# Patient Record
Sex: Female | Born: 1995 | Race: White | Hispanic: No | Marital: Single | Smoking: Never smoker
Health system: Southern US, Community
[De-identification: ages and names within clinical notes are randomized; demographics above are authoritative.]

---

## 2015-02-01 ENCOUNTER — Emergency Department (HOSPITAL_BASED_OUTPATIENT_CLINIC_OR_DEPARTMENT_OTHER)
Admission: EM | Admit: 2015-02-01 | Discharge: 2015-02-01 | Discharge status: Against Medical Advice | Payer: Self-pay | Attending: Emergency Medicine | Admitting: Emergency Medicine

## 2015-02-01 ENCOUNTER — Encounter (HOSPITAL_BASED_OUTPATIENT_CLINIC_OR_DEPARTMENT_OTHER): Payer: Self-pay | Admitting: *Deleted

## 2015-02-01 DIAGNOSIS — R220 Localized swelling, mass and lump, head: Secondary | ICD-10-CM | POA: Insufficient documentation

## 2015-02-01 NOTE — ED Notes (Signed)
Facial swelling. Possible dental abscess. To ED via EMS. Hx of dental abscess about a year ago.

## 2019-10-30 ENCOUNTER — Encounter (HOSPITAL_COMMUNITY): Payer: Self-pay

## 2019-10-30 ENCOUNTER — Emergency Department (HOSPITAL_COMMUNITY)
Admission: EM | Admit: 2019-10-30 | Discharge: 2019-10-30 | Disposition: A | Discharge status: Home / Self Care | Payer: Self-pay | Attending: Emergency Medicine | Admitting: Emergency Medicine

## 2019-10-30 ENCOUNTER — Other Ambulatory Visit: Payer: Self-pay

## 2019-10-30 DIAGNOSIS — R1030 Lower abdominal pain, unspecified: Secondary | ICD-10-CM | POA: Insufficient documentation

## 2019-10-30 DIAGNOSIS — Z5321 Procedure and treatment not carried out due to patient leaving prior to being seen by health care provider: Secondary | ICD-10-CM | POA: Insufficient documentation

## 2019-10-30 NOTE — ED Triage Notes (Signed)
Pt BIB EMS from parking lot. EMS reports pt lethargic and drowsy upon arrival, pt denies taking any drugs (heroin) today. Pt states she has not taken any drugs (heroin) in 5 weeks. Pt c/o lower abdominal pain. Pt states last bowel movement 3 days ago.   114/72 120 HR 16 Resp 97% RA 98.7 CBG 101

## 2019-10-30 NOTE — ED Notes (Signed)
Pt has eloped. Pt did not inform staff she was leaving. Pt was ambulatory, A&Ox4.

## 2019-11-06 ENCOUNTER — Other Ambulatory Visit: Payer: Self-pay

## 2019-11-06 ENCOUNTER — Emergency Department (HOSPITAL_COMMUNITY)
Admission: EM | Admit: 2019-11-06 | Discharge: 2019-11-06 | Discharge status: Against Medical Advice | Payer: Self-pay | Attending: Emergency Medicine | Admitting: Emergency Medicine

## 2019-11-06 DIAGNOSIS — R1013 Epigastric pain: Secondary | ICD-10-CM | POA: Insufficient documentation

## 2019-11-06 DIAGNOSIS — R1031 Right lower quadrant pain: Secondary | ICD-10-CM | POA: Insufficient documentation

## 2019-11-06 DIAGNOSIS — R109 Unspecified abdominal pain: Secondary | ICD-10-CM

## 2019-11-06 DIAGNOSIS — R1011 Right upper quadrant pain: Secondary | ICD-10-CM | POA: Insufficient documentation

## 2019-11-06 LAB — I-STAT BETA HCG BLOOD, ED (MC, WL, AP ONLY): I-stat hCG, quantitative: <5 m[IU]/mL (ref <5)

## 2019-11-06 LAB — CBC WITH DIFFERENTIAL/PLATELET
Abs Immature Granulocytes: 0.05 10*3/uL (ref 0.00–0.07)
Basophils Absolute: 0 10*3/uL (ref 0.0–0.1)
Basophils Relative: 0 %
Eosinophils Absolute: 0.1 10*3/uL (ref 0.0–0.5)
Eosinophils Relative: 1 %
HCT: 38.9 % (ref 36.0–46.0)
Hemoglobin: 12.5 g/dL (ref 12.0–15.0)
Immature Granulocytes: 1 %
Lymphocytes Relative: 14 %
Lymphs Abs: 1.1 10*3/uL (ref 0.7–4.0)
MCH: 28 pg (ref 26.0–34.0)
MCHC: 32.1 g/dL (ref 30.0–36.0)
MCV: 87.2 fL (ref 80.0–100.0)
Monocytes Absolute: 0.7 10*3/uL (ref 0.1–1.0)
Monocytes Relative: 8 %
Neutro Abs: 6 10*3/uL (ref 1.7–7.7)
Neutrophils Relative %: 76 %
Platelets: 359 10*3/uL (ref 150–400)
RBC: 4.46 MIL/uL (ref 3.87–5.11)
RDW: 13.8 % (ref 11.5–15.5)
WBC: 7.9 10*3/uL (ref 4.0–10.5)
nRBC: 0 % (ref 0.0–0.2)

## 2019-11-06 LAB — URINALYSIS, ROUTINE W REFLEX MICROSCOPIC
Bilirubin Urine: NEGATIVE
Glucose, UA: NEGATIVE mg/dL
Ketones, ur: NEGATIVE mg/dL
Leukocytes,Ua: NEGATIVE
Nitrite: NEGATIVE
Protein, ur: NEGATIVE mg/dL
Specific Gravity, Urine: 1.004 — ABNORMAL LOW (ref 1.005–1.030)
pH: 8 (ref 5.0–8.0)

## 2019-11-06 LAB — LIPASE, BLOOD: Lipase: 13 U/L (ref 11–51)

## 2019-11-06 LAB — COMPREHENSIVE METABOLIC PANEL
ALT: 12 U/L (ref 0–44)
AST: 22 U/L (ref 15–41)
Albumin: 2.5 g/dL — ABNORMAL LOW (ref 3.5–5.0)
Alkaline Phosphatase: 57 U/L (ref 38–126)
Anion gap: 15 (ref 5–15)
BUN: <5 mg/dL — ABNORMAL LOW (ref 6–20)
CO2: 21 mmol/L — ABNORMAL LOW (ref 22–32)
Calcium: 8.4 mg/dL — ABNORMAL LOW (ref 8.9–10.3)
Chloride: 99 mmol/L (ref 98–111)
Creatinine, Ser: 0.58 mg/dL (ref 0.44–1.00)
GFR calc Af Amer: >60 mL/min (ref >60)
GFR calc non Af Amer: >60 mL/min (ref >60)
Glucose, Bld: 95 mg/dL (ref 70–99)
Potassium: 4 mmol/L (ref 3.5–5.1)
Sodium: 135 mmol/L (ref 135–145)
Total Bilirubin: 0.6 mg/dL (ref 0.3–1.2)
Total Protein: 7.3 g/dL (ref 6.5–8.1)

## 2019-11-06 MED ORDER — HYDROMORPHONE HCL 1 MG/ML IJ SOLN
1.0000 mg | Freq: Once | INTRAMUSCULAR | Status: AC
  Administered 2019-11-06: 11:00:00 1 mg via INTRAVENOUS
  Filled 2019-11-06: qty 1

## 2019-11-06 MED ORDER — KETOROLAC TROMETHAMINE 15 MG/ML IJ SOLN
15.0000 mg | Freq: Once | INTRAMUSCULAR | Status: AC
  Administered 2019-11-06: 15 mg via INTRAVENOUS
  Filled 2019-11-06: qty 1

## 2019-11-06 MED ORDER — SODIUM CHLORIDE 0.9 % IV BOLUS
500.0000 mL | Freq: Once | INTRAVENOUS | Status: AC
  Administered 2019-11-06: 500 mL via INTRAVENOUS

## 2019-11-06 MED ORDER — HYDROMORPHONE HCL 1 MG/ML IJ SOLN
2.0000 mg | Freq: Once | INTRAMUSCULAR | Status: AC
  Administered 2019-11-06: 2 mg via INTRAVENOUS
  Filled 2019-11-06: qty 2

## 2019-11-06 NOTE — ED Provider Notes (Signed)
[facility] EMERGENCY DEPARTMENT Provider Note  CSN: [id number] Arrival date & time: 11/06/19  0946    History Chief Complaint  Patient presents with  . Abdominal Pain    Nicole Rodgers is a 24 y.o. female who reports no past medical history presented to ED with abdominal pain.  She reports onset was gradual about 3 days ago.  She describing right lower quadrant abdominal pain.  She said the pain has been constant and getting worse for the past 3 days.  She denies any nausea or vomiting.  She says she has not had a bowel movement in many days but that is typical for her.  She says the pain is very sharp pain in the right lower abdomen but also higher up in the right abdomen.  She has never had this kind of pain before.  It is currently 10 out of 10.  She does report dysuria for several days.  Her pain is worse with movement and with breathing.  Nothing makes it better.  She is currently on her menstrual period and does have some intermittent vaginal bleeding.  She denies fevers or chills.  No abdominal surgical hx  Hx of ovarian cyst a few years ago  HPI     No past medical history on file.  There are no problems to display for this patient.   No past surgical history on file.   OB History   No obstetric history on file.     No family history on file.  Social History   Tobacco Use  . Smoking status: Never Smoker  Substance Use Topics  . Alcohol use: No  . Drug use: Yes    Home Medications Prior to Admission medications   Not on File    Allergies    Lac bovis  Review of Systems   Review of Systems  Constitutional: Negative for chills and fever.  Eyes: Negative for photophobia and visual disturbance.  Respiratory: Negative for cough and shortness of breath.   Cardiovascular: Negative for chest pain and palpitations.  Gastrointestinal: Positive for abdominal pain and constipation. Negative for nausea and vomiting.  Genitourinary: Positive  for dysuria and vaginal bleeding.  Musculoskeletal: Negative for arthralgias and back pain.  Skin: Negative for rash and wound.  Neurological: Negative for syncope and light-headedness.  All other systems reviewed and are negative.   Physical Exam Updated Vital Signs BP 101/66   Pulse 94   Temp 98.4 F (36.9 C) (Oral)   Resp 15   Ht 5\' 6"  (1.676 m)   Wt 63.5 kg   LMP 10/30/2019   SpO2 97%   BMI 22.60 kg/m   Physical Exam Vitals and nursing note reviewed.  Constitutional:      General: She is in acute distress.  HENT:     Head: Normocephalic and atraumatic.  Eyes:     Conjunctiva/sclera: Conjunctivae normal.  Cardiovascular:     Rate and Rhythm: Normal rate and regular rhythm.     Heart sounds: No murmur.  Pulmonary:     Effort: Pulmonary effort is normal. No respiratory distress.     Breath sounds: Normal breath sounds.  Abdominal:     Palpations: Abdomen is soft.     Tenderness: There is abdominal tenderness in the right upper quadrant, right lower quadrant and epigastric area. There is guarding. There is no right CVA tenderness, left CVA tenderness or rebound. Positive signs include McBurney's sign.  Musculoskeletal:     Cervical back: Neck  supple.  Skin:    General: Skin is warm and dry.     Comments: Small puncture marks on left forearm  Neurological:     General: No focal deficit present.     Mental Status: She is alert and oriented to person, place, and time.     ED Results / Procedures / Treatments   Labs (all labs ordered are listed, but only abnormal results are displayed) Labs Reviewed  COMPREHENSIVE METABOLIC PANEL - Abnormal; Notable for the following components:      Result Value   CO2 21 (*)    BUN <5 (*)    Calcium 8.4 (*)    Albumin 2.5 (*)    All other components within normal limits  URINALYSIS, ROUTINE W REFLEX MICROSCOPIC - Abnormal; Notable for the following components:   Specific Gravity, Urine 1.004 (*)    Hgb urine dipstick  MODERATE (*)    Bacteria, UA RARE (*)    All other components within normal limits  CBC WITH DIFFERENTIAL/PLATELET  LIPASE, BLOOD  I-STAT BETA HCG BLOOD, ED ([facility], [facility], [facility] ONLY)    EKG None  Radiology No results found.  Procedures Procedures (including critical care time)  Medications Ordered in ED Medications  HYDROmorphone (DILAUDID) injection 1 mg (1 mg Intravenous Given 11/06/19 1031)  ketorolac (TORADOL) 15 MG/ML injection 15 mg (15 mg Intravenous Given 11/06/19 1031)  sodium chloride 0.9 % bolus 500 mL (0 mLs Intravenous Stopped 11/06/19 1337)  HYDROmorphone (DILAUDID) injection 2 mg (2 mg Intravenous Given 11/06/19 1209)    ED Course  I have reviewed the triage vital signs and the nursing notes.  Pertinent labs & imaging results that were available during my care of the patient were reviewed by me and considered in my medical decision making (see chart for details).  24 yo female here with 3 days of worsening abdominal pain She later reports to me that she is a heavy daily opioid user (heroin, IV opioids) and also feels she is withdrawing, but specifies that her abdominal pain is new and idfferent than her typical withdrawal symptoms  This complaint involves an extensive number of treatment options, and is a complaint that carries with it a high risk of complications and morbidity.  The differential diagnosis includes appendicitis vs diveritculitis vs biliary disease vs bowel obstruction vs colitis vs constipation vs cystitis vs pyelonephritis vs kidney stone vs other  No signs or symptoms of sepsis on initial presentation, afebrile Low suspicion for PID, ovarian abscess/torsion given her very sharp and reproducible tenderness on exam including right lower, right middle and right upper abdomen.    Priority is a CT scan to evaluate for perforation vs appendicitis vs intraabdominal emergency.   If her CT imaging is unremarkable, I would proceed with a pelvic exam and consideration for  pelvic ultrasound.  I ordered, reviewed, and interpreted labs, which included BMP, Lipase, CBC, UA.   I ordered medication IV dilaudid and IV toradol for pain control I ordered imaging studies which included CT abdomen and pelvis  No clear evidence of infection on initial labs including UA and CBC.  Lipase wnl.   Clinical Course as of Nov 06 1826  Fri Nov 06, 2019  1123 Patient called me into the room to tell me that she feels like she is going through opioid withdrawal.  She tells me she uses heroin daily and has an extremely high pain tolerance.  She was given a milligram of Dilaudid 45 minutes ago and says it  is a nothing for pain.  I explained that we're not to going to be able to match her outpatient opioid consumption in the ED, but I'm willing to give her another dose of IV medications while waiting on CT scan.   I still think we need a CT scan with her reported symptoms and her tenderness on exam.  We've consulted the IV team as she lost her original IV line.   [MT]  1212 No leukocytosis, no evidence of UTI on UA.  Afebrile with normal vitals.  Pending CT scan   [MT]  1324 Patient is wanting to leave AMA.  She is extremely upset that I am not giving her more IV pain medications, and has told me, " If you're not going to do anything for me I'm leaving."   She has already received 2 dose of IV Dilaudid.  I did explain to her that my priority as an EM doctor is to ensure that she is not having a surgical or life-threatening emergency inside her abdomen or pelvis.  I will do my best to manage her pain, but I have already prescribed hefty doses of our strongest pain medication, and I will not prescribe additional opioids until I have a clearer diagnosis.  She wants to leave.  Instructions were given on her discharge encouraging her to follow up or return if her symptoms worsen.  Discharged AMA   [MT]    Clinical Course User Index [MT] Crystalyn Delia, Kermit Balo, MD    Final Clinical Impression(s) / ED  Diagnoses Final diagnoses:  Abdominal pain, unspecified abdominal location    Rx / DC Orders ED Discharge Orders    None       Renaye Rakers Kermit Balo, MD 11/06/19 [phone]

## 2019-11-06 NOTE — ED Triage Notes (Signed)
Pt arrived via [facility], stating she had abdominal pain x3days. Pt. Stated she started her period 1 week ago however is still bleeding and don't think its coming from her period.  C?O rt. Flank pain. Denies any kidney disease. States her pain 10/10 sharp and constant.  Hx. Of ovarian cyst a few years ago.  EMS VS- 110/62, 103, 18, 96% on RA, 98.4  Pt. Hollering out and crying stating she couldn't move from stretcher to bed.

## 2019-11-06 NOTE — Discharge Instructions (Addendum)
You told us you wanted to leave against medical advice before your workup could be completed.  I had discussed with you my priority to ensure that you do not have an emergency infection or wound inside your abdomen or pelvis.  We did not get a CT scan performed today.  You had asked for pain medication and had been given two doses of very strong medications, an opioid called dilaudid. This is the upper limit of what I felt comfortable prescribing for pain management.  If your pain worsens, you should return to the ER.  Abdominal pain can be caused by many things, some of them non-threatening (like kidney stones, constipation), but some of them potentially life-threatening (like appendicitis, infections, etc).

## 2019-11-06 NOTE — ED Notes (Signed)
Pt. Alert and oriented x4, refusing treatment. Family attempted to talk to pt however she continued to refused.  CT was called and stated she would be 2nd however patient continued to refuse. Patient left AMA. Discharge paperwork signed.

## 2019-12-19 ENCOUNTER — Emergency Department (HOSPITAL_COMMUNITY): Payer: Self-pay

## 2019-12-19 ENCOUNTER — Emergency Department (HOSPITAL_COMMUNITY)
Admission: EM | Admit: 2019-12-19 | Discharge: 2019-12-19 | Payer: Self-pay | Attending: Emergency Medicine | Admitting: Emergency Medicine

## 2019-12-19 DIAGNOSIS — M795 Residual foreign body in soft tissue: Secondary | ICD-10-CM | POA: Insufficient documentation

## 2019-12-19 DIAGNOSIS — R569 Unspecified convulsions: Secondary | ICD-10-CM | POA: Insufficient documentation

## 2019-12-19 LAB — CBC WITH DIFFERENTIAL/PLATELET
Abs Immature Granulocytes: 0.01 10*3/uL (ref 0.00–0.07)
Basophils Absolute: 0 10*3/uL (ref 0.0–0.1)
Basophils Relative: 1 %
Eosinophils Absolute: 0.1 10*3/uL (ref 0.0–0.5)
Eosinophils Relative: 2 %
HCT: 36.8 % (ref 36.0–46.0)
Hemoglobin: 11.7 g/dL — ABNORMAL LOW (ref 12.0–15.0)
Immature Granulocytes: 0 %
Lymphocytes Relative: 25 %
Lymphs Abs: 1.7 10*3/uL (ref 0.7–4.0)
MCH: 27.8 pg (ref 26.0–34.0)
MCHC: 31.8 g/dL (ref 30.0–36.0)
MCV: 87.4 fL (ref 80.0–100.0)
Monocytes Absolute: 0.5 10*3/uL (ref 0.1–1.0)
Monocytes Relative: 7 %
Neutro Abs: 4.2 10*3/uL (ref 1.7–7.7)
Neutrophils Relative %: 65 %
Platelets: 330 10*3/uL (ref 150–400)
RBC: 4.21 MIL/uL (ref 3.87–5.11)
RDW: 15.4 % (ref 11.5–15.5)
WBC: 6.5 10*3/uL (ref 4.0–10.5)
nRBC: 0 % (ref 0.0–0.2)

## 2019-12-19 LAB — RAPID URINE DRUG SCREEN, HOSP PERFORMED
Amphetamines: NOT DETECTED
Barbiturates: NOT DETECTED
Benzodiazepines: POSITIVE — AB
Cocaine: NOT DETECTED
Opiates: NOT DETECTED
Tetrahydrocannabinol: NOT DETECTED

## 2019-12-19 LAB — VALPROIC ACID LEVEL: Valproic Acid Lvl: <10 ug/mL — ABNORMAL LOW (ref 50.0–100.0)

## 2019-12-19 LAB — COMPREHENSIVE METABOLIC PANEL
ALT: 12 U/L (ref 0–44)
AST: 17 U/L (ref 15–41)
Albumin: 3.5 g/dL (ref 3.5–5.0)
Alkaline Phosphatase: 59 U/L (ref 38–126)
Anion gap: 10 (ref 5–15)
BUN: 7 mg/dL (ref 6–20)
CO2: 25 mmol/L (ref 22–32)
Calcium: 9.1 mg/dL (ref 8.9–10.3)
Chloride: 102 mmol/L (ref 98–111)
Creatinine, Ser: 0.7 mg/dL (ref 0.44–1.00)
GFR calc Af Amer: >60 mL/min (ref >60)
GFR calc non Af Amer: >60 mL/min (ref >60)
Glucose, Bld: 93 mg/dL (ref 70–99)
Potassium: 3.9 mmol/L (ref 3.5–5.1)
Sodium: 137 mmol/L (ref 135–145)
Total Bilirubin: 0.6 mg/dL (ref 0.3–1.2)
Total Protein: 7.8 g/dL (ref 6.5–8.1)

## 2019-12-19 LAB — I-STAT BETA HCG BLOOD, ED (MC, WL, AP ONLY): I-stat hCG, quantitative: <5 m[IU]/mL (ref <5)

## 2019-12-19 LAB — MAGNESIUM: Magnesium: 1.9 mg/dL (ref 1.7–2.4)

## 2019-12-19 LAB — CBG MONITORING, ED: Glucose-Capillary: 90 mg/dL (ref 70–99)

## 2019-12-19 LAB — ETHANOL: Alcohol, Ethyl (B): <10 mg/dL (ref <10)

## 2019-12-19 MED ORDER — DIVALPROEX SODIUM 500 MG PO DR TAB: 500.0000 mg | DELAYED_RELEASE_TABLET | Freq: Two times a day (BID) | ORAL | 0 refills | Status: AC

## 2019-12-19 MED ORDER — VALPROATE SODIUM 500 MG/5ML IV SOLN
1200.0000 mg | Freq: Once | INTRAVENOUS | Status: AC
  Administered 2019-12-19: 1200 mg via INTRAVENOUS
  Filled 2019-12-19: qty 12

## 2019-12-19 MED ORDER — LIDOCAINE-EPINEPHRINE-TETRACAINE (LET) TOPICAL GEL
3.0000 mL | Freq: Once | TOPICAL | Status: AC
  Administered 2019-12-19: 3 mL via TOPICAL
  Filled 2019-12-19: qty 3

## 2019-12-19 NOTE — ED Provider Notes (Signed)
[facility] Provider Note   CSN: [id number] Arrival date & time: 12/19/19  1432     History Chief Complaint  Patient presents with  . Seizures    Nicole Rodgers is a 24 y.o. female.  The history is provided by the patient, the EMS personnel and the police. No language interpreter was used.  Seizures  Nicole Rodgers is a 24 y.o. female who presents to the Emergency Department complaining of seizure. Level V caveat due to confusion. The patient presents to the emergency department by EMS and police custody following seizure like activity. She states that she has a history of seizure disorder and has been off of her Depakote for the last three months. She is unsure when her last seizure was. She also has a history of drug abuse and did snort heroin today. She was found by police in a stolen vehicle and when they attempted to stop her if she ran about a quarter to half a mile. When they found her she stated that she was thirsty and then had what they describe as generalized seizure activity that lasted about seven minutes with foaming at the mouth. Following that episode she was groggy but conversant. She was transported to the hospital by EMS. On EMS transport she developed additional generalized tonic clinic activity it was treated with midazolam. She currently complains of feeling fatigued and has a mild headache.    No past medical history on file.  There are no problems to display for this patient.   No past surgical history on file.   OB History   No obstetric history on file.     No family history on file.  Social History   Tobacco Use  . Smoking status: Never Smoker  Substance Use Topics  . Alcohol use: No  . Drug use: Yes    Home Medications Prior to Admission medications   Medication Sig Start Date End Date Taking? Authorizing Provider  divalproex (DEPAKOTE) 500 MG DR tablet Take 1 tablet (500 mg total) by mouth 2 (two) times  daily. 12/19/19   Quintella Reichert, MD    Allergies    Lac bovis  Review of Systems   Review of Systems  Neurological: Positive for seizures.  All other systems reviewed and are negative.   Physical Exam Updated Vital Signs BP 121/82 (BP Location: Right Arm)   Pulse (!) 108   Temp 97.9 F (36.6 C) (Oral)   Resp 18   SpO2 100%   Physical Exam Vitals and nursing note reviewed.  Constitutional:      Appearance: She is well-developed.  HENT:     Head: Normocephalic and atraumatic.     Comments: No tongue injury Cardiovascular:     Rate and Rhythm: Normal rate and regular rhythm.     Heart sounds: No murmur heard.   Pulmonary:     Effort: Pulmonary effort is normal. No respiratory distress.     Breath sounds: Normal breath sounds.  Abdominal:     Palpations: Abdomen is soft.     Tenderness: There is no abdominal tenderness. There is no guarding or rebound.  Musculoskeletal:        General: No tenderness.     Cervical back: Neck supple.  Skin:    General: Skin is warm and dry.  Neurological:     Mental Status: She is alert and oriented to person, place, and time.     Comments: Pupils equal round and reactive. EOM I.  Visual fields are grossly intact. Five out of five strength in all four extremities with sensation to light touch intact in all four extremities  Psychiatric:        Behavior: Behavior normal.     ED Results / Procedures / Treatments   Labs (all labs ordered are listed, but only abnormal results are displayed) Labs Reviewed  RAPID URINE DRUG SCREEN, HOSP PERFORMED - Abnormal; Notable for the following components:      Result Value   Benzodiazepines POSITIVE (*)    All other components within normal limits  CBC WITH DIFFERENTIAL/PLATELET - Abnormal; Notable for the following components:   Hemoglobin 11.7 (*)    All other components within normal limits  VALPROIC ACID LEVEL - Abnormal; Notable for the following components:   Valproic Acid Lvl <10 (*)      All other components within normal limits  COMPREHENSIVE METABOLIC PANEL  ETHANOL  MAGNESIUM  CBC WITH DIFFERENTIAL/PLATELET  CBG MONITORING, ED  I-STAT BETA HCG BLOOD, ED ([facility], [facility], [facility] ONLY)    EKG EKG Interpretation  Date/Time:  Saturday December 19 2019 14:41:34 EDT Ventricular Rate:  117 PR Interval:    QRS Duration: 77 QT Interval:  321 QTC Calculation: 448 R Axis:   63 Text Interpretation: Sinus tachycardia ST elev, probable normal early repol pattern Baseline wander in lead(s) V2 V3 V4 V5 V6 Partial missing lead(s): V2 V3 V4 V5 V6 No old tracing to compare Confirmed by Melene Plan [phone]) on 12/19/2019 2:42:59 PM   Radiology CT Head Wo Contrast  Result Date: 12/19/2019 CLINICAL DATA:  Possible seizure EXAM: CT HEAD WITHOUT CONTRAST TECHNIQUE: Contiguous axial images were obtained from the base of the skull through the vertex without intravenous contrast. COMPARISON:  None. FINDINGS: Brain: No evidence of acute infarction, hemorrhage, hydrocephalus, extra-axial collection or mass lesion/mass effect. Vascular: No hyperdense vessel or unexpected calcification. Skull: Normal. Negative for fracture or focal lesion. Sinuses/Orbits: No acute finding. Other: None. IMPRESSION: No acute intracranial process. Electronically Signed   By: Romona Curls M.D.   On: 12/19/2019 16:54    Procedures .Foreign Body Removal  Date/Time: 12/19/2019 3:36 PM Performed by: Tilden Fossa, MD Authorized by: Tilden Fossa, MD  Body area: skin General location: trunk Location details: abdomen Anesthesia method: topical.  Anesthesia: Local Anesthetic: LET (lido,epi,tetracaine)  Sedation: Patient sedated: no  Patient cooperative: yes Removal mechanism: manual traction. Dressing: antibiotic ointment and dressing applied Tendon involvement: none Depth: subcutaneous Complexity: simple 2 objects recovered. Post-procedure assessment: foreign body removed Patient tolerance: patient tolerated the  procedure well with no immediate complications Comments: Two Taser barbs were removed from the patient. One was located in the left upper thigh. The one in the left upper thigh was easily removed with traction. The Taser Barb in the abdominal wall could not be removed with medial traction and a very small incision with a number 11 blade was made along the Barb tract and the bar was removed with medial traction. The patient was anesthetized with topical LET gel.   (including critical care time)  Medications Ordered in ED Medications  lidocaine-EPINEPHrine-tetracaine (LET) topical gel (3 mLs Topical Given 12/19/19 1539)  valproate (DEPACON) 1,200 mg in dextrose 5 % 50 mL IVPB (0 mg Intravenous Stopped 12/19/19 1939)    ED Course  I have reviewed the triage vital signs and the nursing notes.  Pertinent labs & imaging results that were available during my care of the patient were reviewed by me and considered in my medical decision  making (see chart for details).    MDM Rules/Calculators/A&P                         Patient presents and police custody following possible seizure like activity. On ED arrival she had witnessed pseudo-seizure activity by oncoming physician. Based off of EMS and police report unclear if she had seizure like activity prior to ED arrival. Unable to obtain all records. She is neurologically intact on evaluation. She reports a history of taking Depakote, will restart. She did try to get out of bed and stick a needle in her neck for police to try and leave the department and she was tased during her ED stay. Taser barbs were removed from the patient per separate procedure note. Plan to discharge and police custody with outpatient follow-up and return precautions. Patient is unclear when her last tetanus shot was, declines having her tetanus shot updated at this time.  Final Clinical Impression(s) / ED Diagnoses Final diagnoses:  Seizure-like activity (HCC)  Foreign body (FB)  in soft tissue    Rx / DC Orders ED Discharge Orders         Ordered    divalproex (DEPAKOTE) 500 MG DR tablet  2 times daily     Discontinue  Reprint     12/19/19 1737           Tilden Fossa, MD 12/19/19 2008

## 2019-12-19 NOTE — ED Notes (Signed)
Pt transported to CT ?

## 2019-12-19 NOTE — ED Notes (Signed)
Pt found in hall, holding needle to throat, tased by [employer]. Provider made aware, pt returned to room, tasers removed. Pt denies suicidal ideation at this time.

## 2019-12-19 NOTE — ED Notes (Signed)
Pt found in hall, holding needle to throat, pt tased by [employer]. Pt returned to room, provider made aware, tasers removed by MD. Pt denies suicidal ideation,

## 2019-12-19 NOTE — ED Triage Notes (Signed)
Pt BIB [facility], in police custody. Pt admits to using fentanyl, meth, cocaine, and heroin in the last hour, began seizing. 5 medazolam given PTA. Pt able to communicate with EMS, still convulsing upon arrival. Pt tachycardic in 110'S, VS stable otherwise. NAD noted.

## 2019-12-19 NOTE — ED Provider Notes (Signed)
MSE was initiated and I personally evaluated the patient and placed orders (if any) at  2:42 PM on December 19, 2019.  The patient appears stable so that the remainder of the MSE may be completed by another provider.  24 year old female with a chief complaints of seizure-like activity.  Patient reportedly has a history of seizures.  Started when she was in police custody.  As I looked in the patient's medical record she has had multiple visits for seizure-like activity usually when she is under custody of the police.  While she was having some shaking activity I was able to provide painful stimuli that got her seizure to stop and she turned and looked at me.  When a dressing was being removed while the patient was shaking she stopped and told someone that she need to keep it on.  Per EMS she had had constant contact with them throughout her shaking in route.  Will labs in the ED for short time.  Basic lab work.  CBG.   Melene Plan, DO 12/19/19 1444

## 2021-01-30 DEATH — deceased

## 2021-04-04 IMAGING — CT CT HEAD W/O CM
3 of 4 series · 15 of 47 positions shown, 18 images · non-contrast
Comparison: None.

CLINICAL DATA: Possible seizure

EXAM:
CT HEAD WITHOUT CONTRAST
TECHNIQUE: Contiguous axial images were obtained from the base of the skull
through the vertex without intravenous contrast.

[Series 4: head without · axial · non-contrast · 0.44mm/px · z∈[-81,+44]mm · 9 of 31 slices shown, 12 images]
[im 3/31  brain]
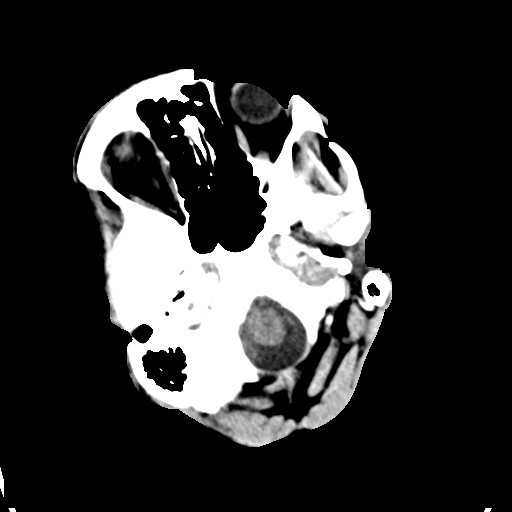
[im 3/31  bone]
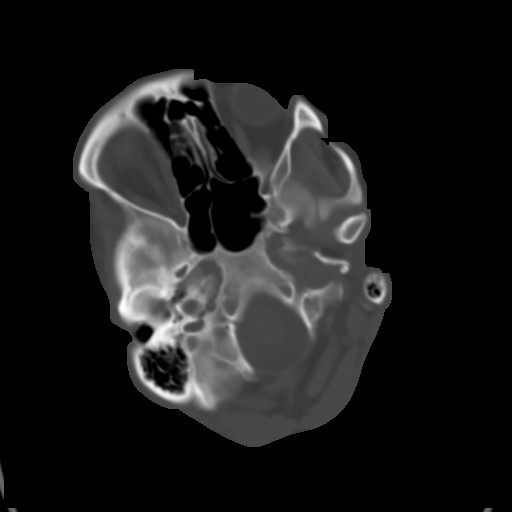
[im 7/31  brain]
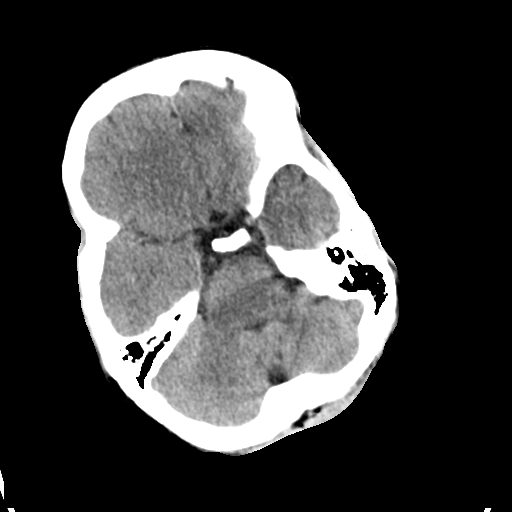
[im 9/31  brain]
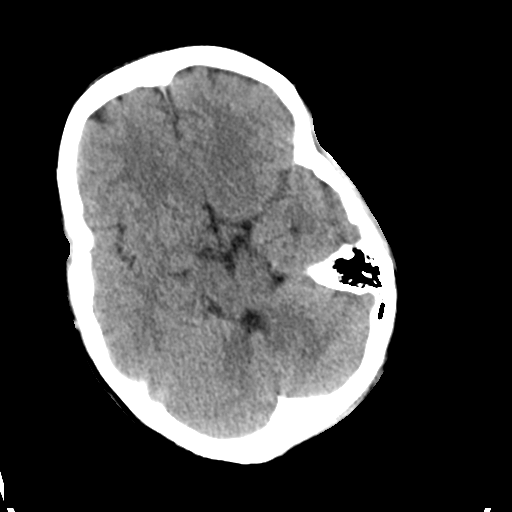
[im 13/31  brain]
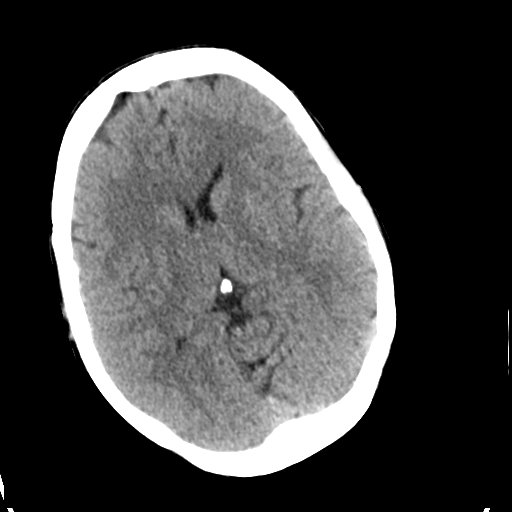
[im 16/31  brain]
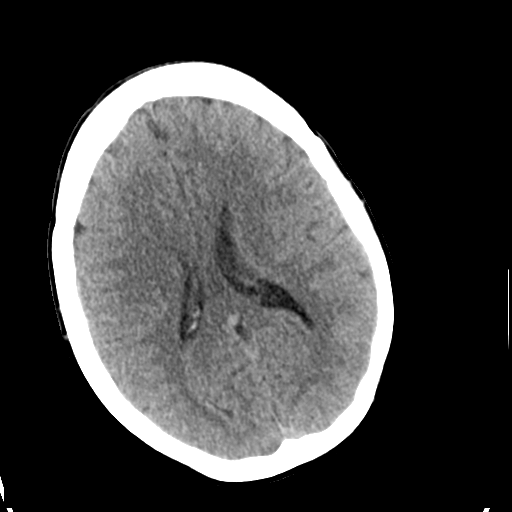
[im 16/31  bone]
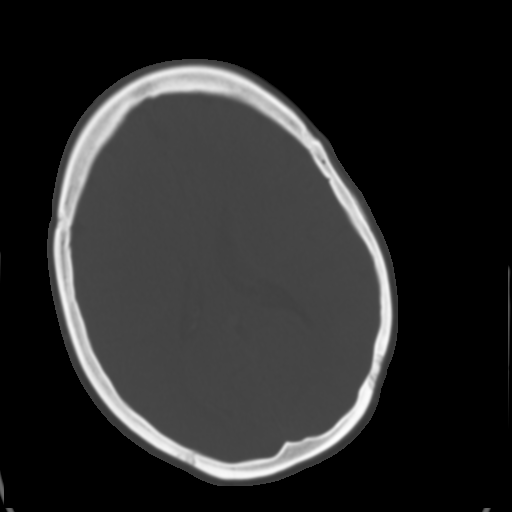
[im 18/31  brain]
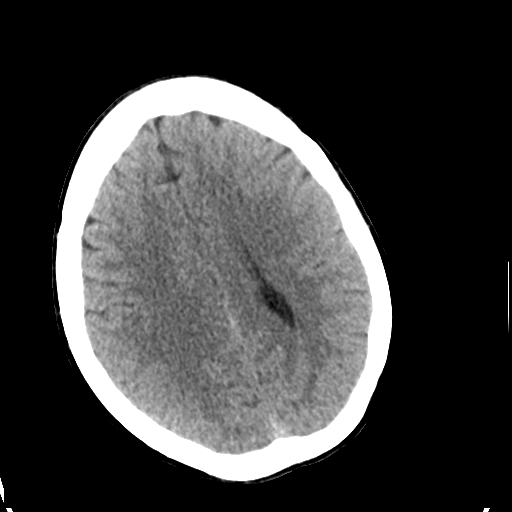
[im 22/31  brain]
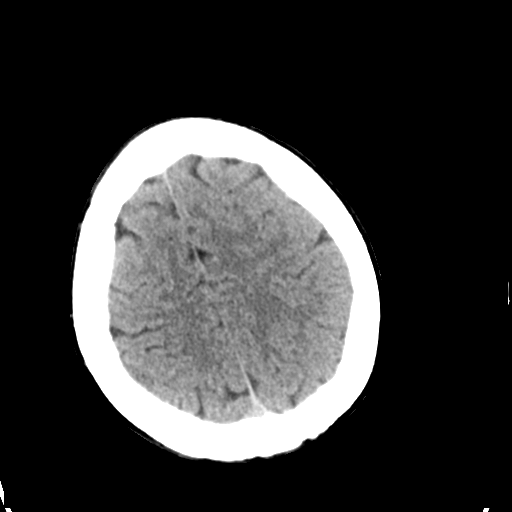
[im 24/31  brain]
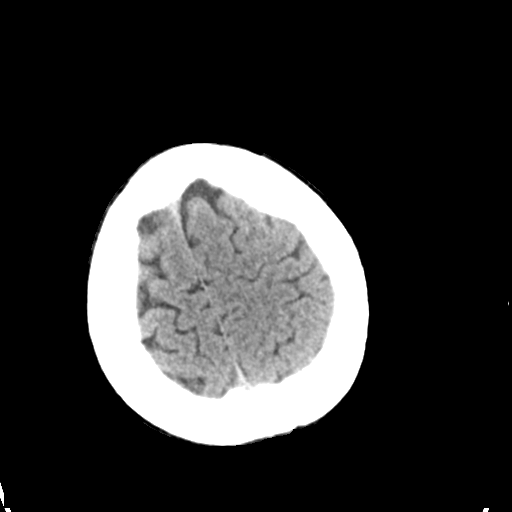
[im 28/31  brain]
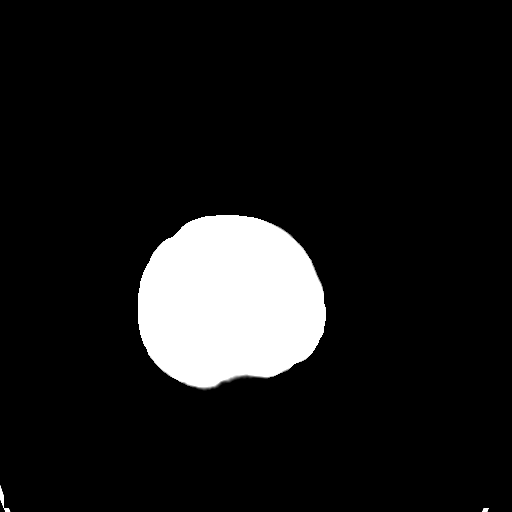
[im 28/31  bone]
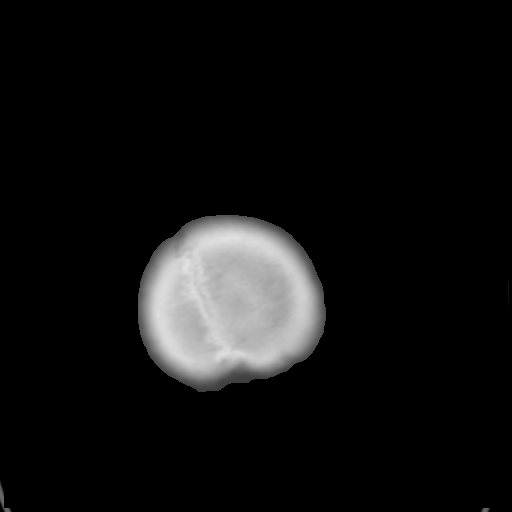

[Series 5: head without cor · coronal · non-contrast · 0.33mm/px · 3 of 66 slices shown]
[im 22/66  brain]
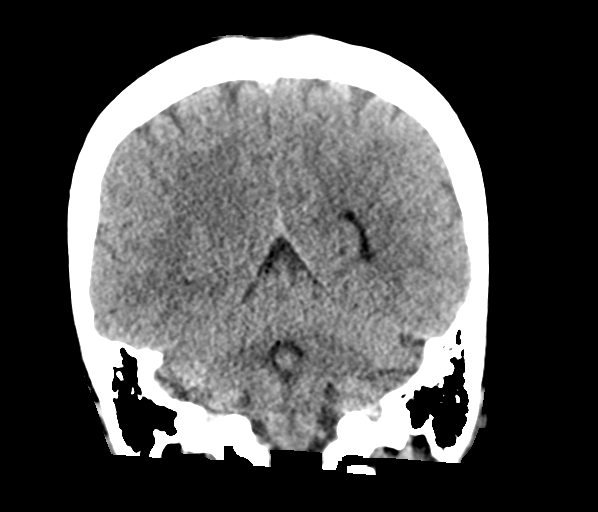
[im 29/66  brain]
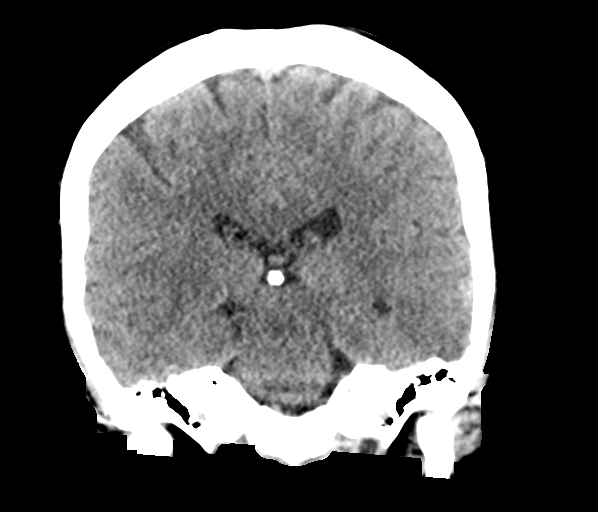
[im 37/66  brain]
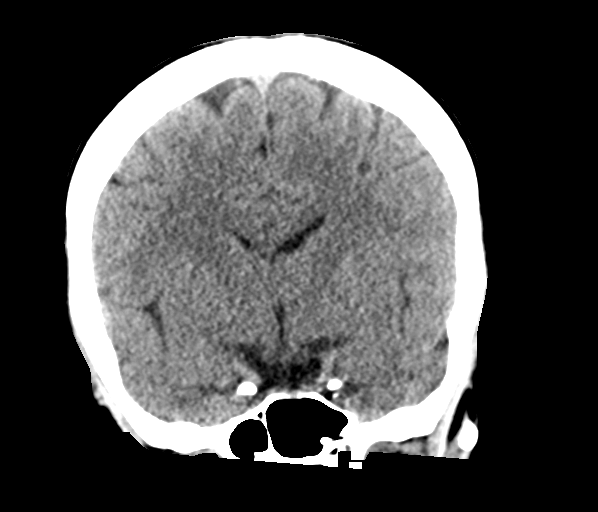

[Series 6: head without sag · sagittal · non-contrast · 0.33mm/px · 3 of 51 slices shown]
[im 17/51  brain]
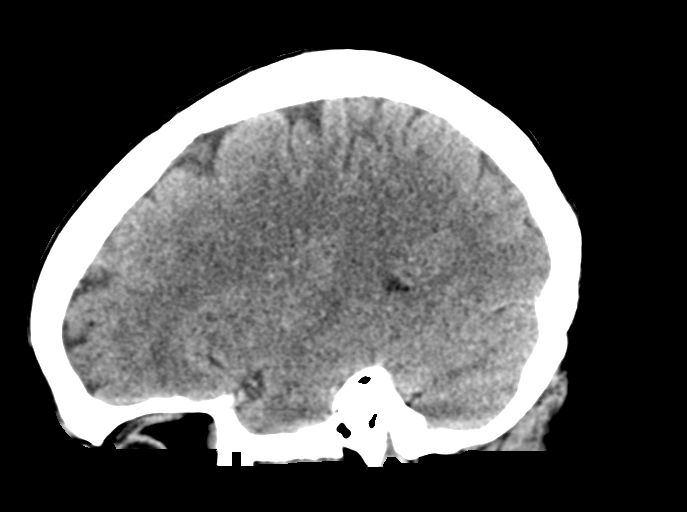
[im 26/51  brain]
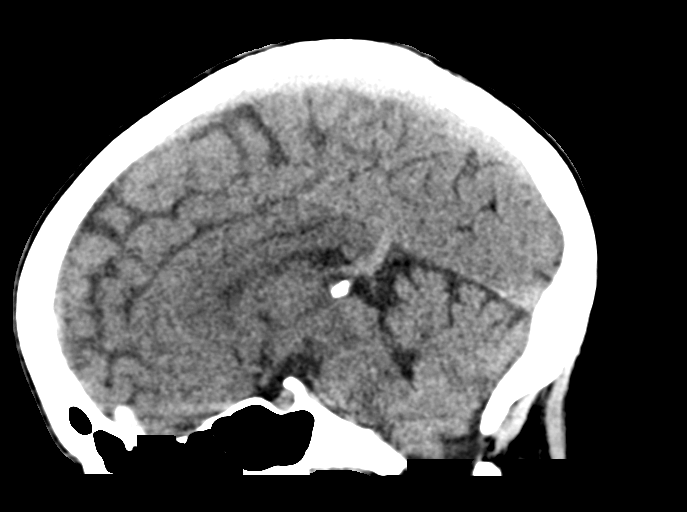
[im 34/51  brain]
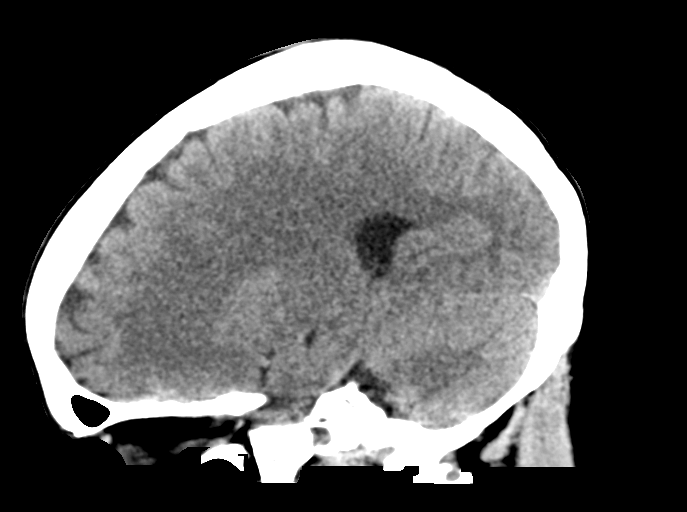

[15 of 47 positions shown; findings below may reference images not displayed]

FINDINGS: Brain: No evidence of acute infarction, hemorrhage, hydrocephalus,
extra-axial collection or mass lesion/mass effect.

Vascular: No hyperdense vessel or unexpected calcification.

Skull: Normal. Negative for fracture or focal lesion.

Sinuses/Orbits: No acute finding.

Other: None.
IMPRESSION: No acute intracranial process.
